# Patient Record
Sex: Female | Born: 1935 | Race: White | Hispanic: No | Smoking: Never smoker
Health system: Southern US, Community
[De-identification: ages and names within clinical notes are randomized; demographics above are authoritative.]

## PROBLEM LIST (undated history)

## (undated) DIAGNOSIS — F0390 Unspecified dementia without behavioral disturbance: Secondary | ICD-10-CM

## (undated) DIAGNOSIS — Z950 Presence of cardiac pacemaker: Secondary | ICD-10-CM

## (undated) DIAGNOSIS — I2510 Atherosclerotic heart disease of native coronary artery without angina pectoris: Secondary | ICD-10-CM

---

## 2014-11-13 ENCOUNTER — Encounter (HOSPITAL_COMMUNITY): Payer: Self-pay | Admitting: *Deleted

## 2014-11-13 ENCOUNTER — Emergency Department (HOSPITAL_COMMUNITY)
Admission: EM | Admit: 2014-11-13 | Discharge: 2014-11-13 | Disposition: A | Discharge status: Home / Self Care | Payer: Medicare Other | Attending: Emergency Medicine | Admitting: Emergency Medicine

## 2014-11-13 ENCOUNTER — Emergency Department (HOSPITAL_COMMUNITY): Payer: Medicare Other

## 2014-11-13 DIAGNOSIS — I2510 Atherosclerotic heart disease of native coronary artery without angina pectoris: Secondary | ICD-10-CM | POA: Diagnosis not present

## 2014-11-13 DIAGNOSIS — Z880 Allergy status to penicillin: Secondary | ICD-10-CM | POA: Diagnosis not present

## 2014-11-13 DIAGNOSIS — Z950 Presence of cardiac pacemaker: Secondary | ICD-10-CM | POA: Insufficient documentation

## 2014-11-13 DIAGNOSIS — F0390 Unspecified dementia without behavioral disturbance: Secondary | ICD-10-CM | POA: Insufficient documentation

## 2014-11-13 DIAGNOSIS — R4182 Altered mental status, unspecified: Secondary | ICD-10-CM | POA: Diagnosis present

## 2014-11-13 DIAGNOSIS — F0391 Unspecified dementia with behavioral disturbance: Secondary | ICD-10-CM

## 2014-11-13 HISTORY — DX: Presence of cardiac pacemaker: Z95.0

## 2014-11-13 HISTORY — DX: Atherosclerotic heart disease of native coronary artery without angina pectoris: I25.10

## 2014-11-13 LAB — COMPREHENSIVE METABOLIC PANEL
ALT: 11 U/L — ABNORMAL LOW (ref 14–54)
ANION GAP: 10 (ref 5–15)
AST: 20 U/L (ref 15–41)
Albumin: 3.7 g/dL (ref 3.5–5.0)
Alkaline Phosphatase: 52 U/L (ref 38–126)
BUN: 17 mg/dL (ref 6–20)
CHLORIDE: 106 mmol/L (ref 101–111)
CO2: 24 mmol/L (ref 22–32)
CREATININE: 0.83 mg/dL (ref 0.44–1.00)
Calcium: 9.4 mg/dL (ref 8.9–10.3)
Glucose, Bld: 111 mg/dL — ABNORMAL HIGH (ref 65–99)
POTASSIUM: 3.8 mmol/L (ref 3.5–5.1)
SODIUM: 140 mmol/L (ref 135–145)
Total Bilirubin: 0.5 mg/dL (ref 0.3–1.2)
Total Protein: 6.5 g/dL (ref 6.5–8.1)

## 2014-11-13 LAB — CBC WITH DIFFERENTIAL/PLATELET
Basophils Absolute: 0 10*3/uL (ref 0.0–0.1)
Basophils Relative: 1 %
EOS ABS: 0.1 10*3/uL (ref 0.0–0.7)
EOS PCT: 2 %
HCT: 37.1 % (ref 36.0–46.0)
Hemoglobin: 12.2 g/dL (ref 12.0–15.0)
LYMPHS ABS: 1.3 10*3/uL (ref 0.7–4.0)
LYMPHS PCT: 21 %
MCH: 31.7 pg (ref 26.0–34.0)
MCHC: 32.9 g/dL (ref 30.0–36.0)
MCV: 96.4 fL (ref 78.0–100.0)
MONO ABS: 0.3 10*3/uL (ref 0.1–1.0)
Monocytes Relative: 5 %
Neutro Abs: 4.5 10*3/uL (ref 1.7–7.7)
Neutrophils Relative %: 71 %
PLATELETS: 242 10*3/uL (ref 150–400)
RBC: 3.85 MIL/uL — AB (ref 3.87–5.11)
RDW: 13.4 % (ref 11.5–15.5)
WBC: 6.2 10*3/uL (ref 4.0–10.5)

## 2014-11-13 LAB — URINALYSIS, ROUTINE W REFLEX MICROSCOPIC
BILIRUBIN URINE: NEGATIVE
Glucose, UA: NEGATIVE mg/dL
Ketones, ur: NEGATIVE mg/dL
Leukocytes, UA: NEGATIVE
NITRITE: NEGATIVE
PROTEIN: NEGATIVE mg/dL
SPECIFIC GRAVITY, URINE: 1.007 (ref 1.005–1.030)
UROBILINOGEN UA: 0.2 mg/dL (ref 0.0–1.0)
pH: 6 (ref 5.0–8.0)

## 2014-11-13 LAB — URINE MICROSCOPIC-ADD ON

## 2014-11-13 MED ORDER — QUETIAPINE FUMARATE 50 MG PO TABS: 50.0000 mg | ORAL_TABLET | Freq: Every day | ORAL | Status: AC

## 2014-11-13 MED ORDER — ALPRAZOLAM 0.25 MG PO TABS
1.0000 mg | ORAL_TABLET | Freq: Once | ORAL | Status: AC
  Administered 2014-11-13: 1 mg via ORAL
  Filled 2014-11-13: qty 4

## 2014-11-13 MED ORDER — ALPRAZOLAM 0.5 MG PO TABS: 0.5000 mg | ORAL_TABLET | Freq: Three times a day (TID) | ORAL | Status: AC | PRN

## 2014-11-13 NOTE — Discharge Instructions (Signed)
°Emergency Department Resource Guide °1) Find a Doctor and Pay Out of Pocket °Although you won't have to find out who is covered by your insurance plan, it is a good idea to ask around and get recommendations. You will then need to call the office and see if the doctor you have chosen will accept you as a new patient and what types of options they offer for patients who are self-pay. Some doctors offer discounts or will set up payment plans for their patients who do not have insurance, but you will need to ask so you aren't surprised when you get to your appointment. ° °2) Contact Your Local Health Department °Not all health departments have doctors that can see patients for sick visits, but many do, so it is worth a call to see if yours does. If you don't know where your local health department is, you can check in your phone book. The CDC also has a tool to help you locate your state's health department, and many state websites also have listings of all of their local health departments. ° °3) Find a Walk-in Clinic °If your illness is not likely to be very severe or complicated, you may want to try a walk in clinic. These are popping up all over the country in pharmacies, drugstores, and shopping centers. They're usually staffed by nurse practitioners or physician assistants that have been trained to treat common illnesses and complaints. They're usually fairly quick and inexpensive. However, if you have serious medical issues or chronic medical problems, these are probably not your best option. ° °No Primary Care Doctor: °- Call [facility] at  832-8000 - they can help you locate a primary care doctor that  accepts your insurance, provides certain services, etc. °- Physician Referral Service- 1-800-533-3463 ° °Chronic Pain Problems: °Organization         Address  Phone   Notes  °[facility]  (336) 297-2271 Patients need to be referred by their primary care doctor.  ° °Medication  Assistance: °Organization         Address  Phone   Notes  °[place] [facility] [address] °[place], [place] [address] (336) 641-8030 --Must be a resident of [place] °-- Must have NO insurance coverage whatsoever (no Medicaid/ Medicare, etc.) °-- The pt. MUST have a primary care doctor that directs their care regularly and follows them in the community °  °[facility]  (866) 331-1348   °[employer]  (888) 892-1162   ° °Agencies that provide inexpensive medical care: °Organization         Address  Phone   Notes  °[facility]  (336) 832-8035   °[facility]    (336) 832-7272   °[facility] [address] °[place], [place] [address] (336) 832-4777   °[facility] [address], °[place] (336) 271-4999   °[facility]    (336) 373-0678   °[facility]    (336) 272-1050   °[facility] ° [address], [place] Phone:  (336) 832-4444, Fax:  (336) 832-4440 Hours of Operation:  9 am - 6 pm, M-F.  Also accepts Medicaid/Medicare and self-pay.  ° [facility] ° [address], [place] Phone: (336) 832-3150, Fax: (336) 832-3151. Hours of Operation:  8:30 am - 5:30 pm, M-F.  Also accepts Medicaid and self-pay.  °[facility] [place] [address], [place] Phone: (336) 878-6027   °[facility] [address], [place], [place] (336)723-1848, Ext. 123 Mondays & Thursdays: 7-9 AM.  First 15 patients are seen on a first come, first serve basis. °  ° °Medicaid-accepting [place] Providers: ° °Organization         Address  Phone   Notes  °[facility] [address], [place] (336) 641-2100 Also accepts self-pay patients.  °[facility] [address], [place] ° (336) 856-9996   °[facility] [address], [place]  (336) 288-8857   °[facility] [address] [place] [address], [place] (336) 299-7000   °Veita Bland [address], [place]  ° (336) 373-1557 Only accepts [place] Access Medicaid patients after they have their name applied to their card.  ° °Self-Pay (no insurance) in [place]: ° °Organization         Address  Phone   Notes  °Sickle Cell Patients, [facility] [address], [place] (336) 832-1970   °[facility] [address], [place] (336) 832-4400   °[facility] ° [address],  (336) 992-4800   °[facility]/Dr. Osei-Bonsu ° [address] [place] [address], [place] or [address], [place] (336) 841-8500 Phone number for both [place] and [place] locations is the same.  °[facility] [address], [place] (336) 299-0000   °[facility] [address] [place] [address], [place] or [address] (336) 852-7530 °(336) 878-2260   °[facility] [address], [place] (336) 350-1642, phone; (336) 294-5005, fax Sees patients 1st and 3rd Saturday of every month.  Must not qualify for public or private insurance (i.e. Medicaid, Medicare, [place] Health Choice, Veterans' Benefits) • Household income should be no more than 200% of the poverty level •The clinic cannot treat you if you are pregnant or think you are pregnant • Sexually transmitted diseases are not treated at the clinic.  ° ° °Dental Care: °Organization         Address  Phone  Notes  °[place] [facility] [address], [place] (336) 641-6152 Accepts children up to age 21 who are enrolled in Medicaid or [place] Health Choice; pregnant women with a Medicaid card; and children who have applied for Medicaid or [place] Health Choice, but were declined, whose parents can pay a reduced fee at time of service.  °[place]  [facility] [place]  [address], [place] (336) 641-7733 Accepts children up to age 21 who are enrolled in Medicaid or [place] Health Choice; pregnant women with a Medicaid card; and children who have applied for Medicaid or [place] Health Choice, but were declined, whose parents can pay a reduced fee at time of service.  °[facility] ° [address], [place] (336) 641-4533 Patients are seen by appointment only. Walk-ins are not accepted. [facility] will see patients 18 years of age and older. °Monday - Tuesday (8am-5pm) °Most Wednesdays (8:30-5pm) °$30 per visit, cash only  °[facility] ° [address], [place] (336) 641-4533 Patients are seen by appointment only. Walk-ins are not accepted. [facility] will see patients 18 years of age and older. °One   Wednesday Evening (Monthly: Volunteer Based).  $30 per visit, cash only  °[facility]  (919) 537-3737 for adults; Children under age 4, call Graduate Pediatric Dentistry at (919) [phone]. Children aged 4-14, please call (919) 537-3737 to request a pediatric application. ° Dental services are provided in all areas of dental care including fillings, crowns and bridges, complete and partial dentures, implants, gum treatment, root canals, and extractions. Preventive care is also provided. Treatment is provided to both adults and children. °Patients are selected via a lottery and there is often a waiting list. °  °[facility] [address], °[place] ° (336) 763-8833 [email or web address] °  °[facility] [address], [place], [place] (336)723-1848, Ext. 123 Second and Fourth Thursday of each month, opens at 6:30 AM; Clinic ends at 9 AM.  Patients are seen on a first-come first-served basis, and a limited number are seen during each clinic.  ° °[facility] ° [address], [place], [place] (336) 723-7904    Eligibility Requirements °You must have lived in [place], [place], or [place] counties for at least the last three months. °  You cannot be eligible for state or federal sponsored healthcare insurance, including Veterans Administration, Medicaid, or Medicare. °  You generally cannot be eligible for healthcare insurance through your employer.  °  How to apply: °Eligibility screenings are held every Tuesday and Wednesday afternoon from 1:00 pm until 4:00 pm. You do not need an appointment for the interview!  °[facility] [address], [place], [place] 336-631-2330   °[facility]  336-342-8273   °[facility]  336-703-3100   °[facility]  336-570-6415   ° °Behavioral Health Resources in the Community: °Intensive Outpatient Programs °Organization         Address  Phone  Notes  °[facility] [address], [place], [place] 336-878-6098   °[facility] [address], [place], [place] 336-832-9800   °[facility] [address], [place], [place] ° 336-882-2125   °[facility] [address],  °[place], [place] 1-800-853-5163 or 336-641-4981   °Substance Abuse Resources °Organization         Address  Phone  Notes  °[facility]  336-882-2125   °[facility]  336-784-9470   °[facility]  336-285-9073   °[facility]  336-845-3988   °Residential & Outpatient Substance Abuse Program  1-800-659-3381   °Psychological Services °Organization         Address  Phone  Notes  °[facility]  336- 832-9600   °[facility]  336- 378-7881   °[facility] [address], [place] 1-800-853-5163 or 336-641-4981   ° °Mobile Crisis Teams °Organization         Address  Phone  Notes  °[facility], Mobile Crisis Care Unit  1-877-626-1772   °Assertive °[facility] ° [address], [place] 336-834-9664   °Sharon DeEsch [address] °[place] 336-554-5454   ° °Self-Help/Support Groups °Organization         Address  Phone             Notes  °[facility] - variety of support groups  336- 373-1402 Call for more information  °Narcotics Anonymous (NA), [facility] [address], °[place]  2 meetings at this location  ° °  Residential Treatment Programs °Organization         Address  Phone  Notes  °[facility] [address],    °[place]  1-866-801-8205   °[facility] ° [address], [place], [place] 704-293-8524   °[facility] [address], [place] 336-845-3988 Admissions: 8am-3pm M-F  °[facility] [address]    °[place], [place] 336-841-1104   °[facility] [address], [place], [place] 336-379-7146   °[facility] [address]  °[place], [place] 336-285-9073   °[facility] - Intensive Outpatient [address], [place], [place] 336-852-3033   °[facility] [address]  °[place], [place] 1-877-615-2722 or 336-784-9470   °[facility] [address] [place], [place] 336-227-7417 Accepts Medicaid  °[facility] [address]  °[place] 1-800-659-3381 Substance Abuse/Addiction Treatment  ° °[place] Behavioral Health Resources °Organization         Address  Phone  Notes  °[facility]  (888) 581-9988   °Julie Brannon, PhD [address] [place], [place]   (336) 349-5553 or (336) 951-0000   °[facility]   [address] °[place], [place] (336) 349-4454   °[facility] [address], [place], [place] (336) 342-8316 Insurance/Medicaid/sponsorship through [facility]  °[facility] [address]                                    [place], [place] (336) 342-8316 Therapy/tele-psych/case    °[facility] [address]  ° [place], [place] (336) 349-2233    °Dr. Arfeen  (336) 349-4544   °[facility] [place]  [employer] [place] [facility] 1) [address], [place] °2) [address], [place] °3)  [address], [place] (336) 349-3220 °(336) 342-7768 ° °(336) 342-8140   °[place] [facility] (336) 342-1394 or (336) 342-3537 (After Hours)    ° ° °

## 2014-11-13 NOTE — ED Notes (Signed)
Pt son and granddaughter spoke with this RN regarding pt status. Seeking placement for pt at [facility] and pt is supposed to be evaluated today at 1030 by their facility.

## 2014-11-13 NOTE — ED Notes (Signed)
The npt has had more confusioin in the past 24 hours than usual more confused since 0400am today  She has been incontinent in the past 24 hours  Hx of the same.  She takes tylenol for knee pain and she may have had a temp  C/o rt flank pain

## 2014-11-13 NOTE — ED Notes (Signed)
The pt has also been wandering off in the neibhborhood.  She is more agitated at home .  She acts calm in public

## 2014-11-13 NOTE — ED Provider Notes (Signed)
CSN: [id number]     Arrival date & time 11/13/14  4098 History   First MD Initiated Contact with Patient 11/13/14 [phone]     Chief Complaint  Patient presents with  . Altered Mental Status     (Consider location/radiation/quality/duration/timing/severity/associated sxs/prior Treatment) HPI Comments: An 81 year old female here with her family member secondary to altered mental status. Patient has a history of dementia, however over the last few months and is progressively got worse to the point where she is getting ready to go to work there for which she's going to have an assessment today. This morning around 0300 patient left the house and was wondering the streets. This is new for her. Family is concerned she may have UTI she sometimes gets altered with UTIs. No other associated symptoms.  Patient is a 81 y.o. female presenting with altered mental status. The history is provided by the patient, a relative and a caregiver.  Altered Mental Status Presenting symptoms: behavior changes and confusion   Severity:  Moderate Most recent episode:  More than 2 days ago Episode history:  Continuous Timing:  Constant Progression:  Worsening Chronicity:  Chronic Context: dementia   Context: not alcohol use, not drug use, not a nursing home resident, not a recent change in medication and not a recent illness   Associated symptoms: no abdominal pain, no agitation, no fever, no hallucinations and no headaches     Past Medical History  Diagnosis Date  . Coronary artery disease   . Pacemaker    History reviewed. No pertinent past surgical history. No family history on file. Social History  Substance Use Topics  . Smoking status: Never Smoker   . Smokeless tobacco: None  . Alcohol Use: No   OB History    No data available     Review of Systems  Constitutional: Negative for fever.  Eyes: Negative for pain.  Respiratory: Negative for cough and shortness of breath.   Gastrointestinal:  Negative for abdominal pain.  Neurological: Negative for headaches.  Psychiatric/Behavioral: Positive for confusion. Negative for hallucinations and agitation.  All other systems reviewed and are negative.     Allergies  Penicillins  Home Medications   Prior to Admission medications   Medication Sig Start Date End Date Taking? Authorizing Provider  ALPRAZolam Prudy Feeler) 0.5 MG tablet Take 1 tablet (0.5 mg total) by mouth 3 (three) times daily as needed for anxiety. 11/13/14   Marily Memos, MD  QUEtiapine (SEROQUEL) 50 MG tablet Take 1 tablet (50 mg total) by mouth at bedtime. 11/13/14   Barbara Cower Mesner, MD   BP 104/50 mmHg  Pulse 86  Temp(Src) 98.4 F (36.9 C) (Oral)  Resp 17  SpO2 92% Physical Exam  Constitutional: She appears well-developed and well-nourished.  HENT:  Head: Normocephalic and atraumatic.  Eyes: Conjunctivae and EOM are normal. Right eye exhibits no discharge. Left eye exhibits no discharge.  Cardiovascular: Normal rate and regular rhythm.   Pulmonary/Chest: Effort normal and breath sounds normal. No respiratory distress.  Abdominal: Soft. She exhibits no distension. There is no tenderness. There is no rebound.  Musculoskeletal: Normal range of motion. She exhibits no edema or tenderness.  Neurological: She is alert. She has normal strength. No cranial nerve deficit or sensory deficit. GCS eye subscore is 4. GCS verbal subscore is 5. GCS motor subscore is 6.  Skin: Skin is warm and dry.  Nursing note and vitals reviewed.   ED Course  Procedures (including critical care time) Labs Review Labs Reviewed  URINALYSIS, ROUTINE W REFLEX MICROSCOPIC (NOT AT [facility]) - Abnormal; Notable for the following:    APPearance CLOUDY (*)    Hgb urine dipstick MODERATE (*)    All other components within normal limits  URINE MICROSCOPIC-ADD ON - Abnormal; Notable for the following:    Casts HYALINE CASTS (*)    All other components within normal limits  CBC WITH  DIFFERENTIAL/PLATELET - Abnormal; Notable for the following:    RBC 3.85 (*)    All other components within normal limits  COMPREHENSIVE METABOLIC PANEL - Abnormal; Notable for the following:    Glucose, Bld 111 (*)    ALT 11 (*)    All other components within normal limits    Imaging Review Dg Chest 2 View  11/13/2014   CLINICAL DATA:  Altered mental status.  EXAM: CHEST  2 VIEW  COMPARISON:  None.  FINDINGS: The cardiac silhouette, mediastinal and hilar contours are within normal limits. There is tortuosity and calcification of the thoracic aorta. The pacer wires are in good position. The lungs are clear. The bony thorax is intact.  IMPRESSION: No acute cardiopulmonary findings.   Electronically Signed   By: Rudie Meyer M.D.   On: 11/13/2014 09:01   Ct Head Wo Contrast  11/13/2014   CLINICAL DATA:  Confusion, evaluate for hydrocephalus  EXAM: CT HEAD WITHOUT CONTRAST  TECHNIQUE: Contiguous axial images were obtained from the base of the skull through the vertex without intravenous contrast.  COMPARISON:  None.  FINDINGS: Age-appropriate significant diffuse cortical atrophy. Mild diffuse low attenuation in the white matter. No evidence of hydrocephalus. No hemorrhage or extra-axial fluid. No evidence of mass or vascular territory infarct. Calvarium intact. Visualized portions of the sinuses clear.  IMPRESSION: Age-related involutional change with no acute findings   Electronically Signed   By: Esperanza Heir M.D.   On: 11/13/2014 08:27   I have personally reviewed and evaluated these images and lab results as part of my medical decision-making.   EKG Interpretation   Date/Time:  Sunday November 13 2014 08:05:04 EDT Ventricular Rate:  76 PR Interval:  203 QRS Duration: 182 QT Interval:  462 QTC Calculation: 519 R Axis:   -59 Text Interpretation:  Sinus rhythm Nonspecific IVCD with LAD Left  ventricular hypertrophy Confirmed by MESNER MD, Barbara Cower [phone]) on 11/13/2014  8:27:20 AM       MDM   Final diagnoses:  Dementia, with behavioral disturbance   Likely worsening dementia, will eval for medical causes. Likely dc back to son's house to work on placement.  Workup without acute causes for symptoms. Likely worsening sundowning r/t dementia. Will start on seroquel nightly to be titrated by her pcp.  I have personally and contemperaneously reviewed labs and imaging and used in my decision making as above.   A medical screening exam was performed and I feel the patient has had an appropriate workup for their chief complaint at this time and likelihood of emergent condition existing is low. They have been counseled on decision, discharge, follow up and which symptoms necessitate immediate return to the emergency department. They or their family verbally stated understanding and agreement with plan and discharged in stable condition.      Marily Memos, MD 11/13/14 1239

## 2015-01-19 ENCOUNTER — Emergency Department: Payer: Medicare Other

## 2015-01-19 ENCOUNTER — Inpatient Hospital Stay
Admission: EM | Admit: 2015-01-19 | Discharge: 2015-01-26 | DRG: 690 | Disposition: E | Payer: Medicare Other | Attending: Internal Medicine | Admitting: Internal Medicine

## 2015-01-19 ENCOUNTER — Encounter: Payer: Self-pay | Admitting: Emergency Medicine

## 2015-01-19 DIAGNOSIS — I2510 Atherosclerotic heart disease of native coronary artery without angina pectoris: Secondary | ICD-10-CM | POA: Diagnosis present

## 2015-01-19 DIAGNOSIS — R401 Stupor: Secondary | ICD-10-CM | POA: Diagnosis present

## 2015-01-19 DIAGNOSIS — E876 Hypokalemia: Secondary | ICD-10-CM | POA: Diagnosis present

## 2015-01-19 DIAGNOSIS — W19XXXA Unspecified fall, initial encounter: Secondary | ICD-10-CM | POA: Diagnosis present

## 2015-01-19 DIAGNOSIS — I119 Hypertensive heart disease without heart failure: Secondary | ICD-10-CM | POA: Diagnosis present

## 2015-01-19 DIAGNOSIS — N390 Urinary tract infection, site not specified: Principal | ICD-10-CM | POA: Diagnosis present

## 2015-01-19 DIAGNOSIS — Z950 Presence of cardiac pacemaker: Secondary | ICD-10-CM | POA: Diagnosis not present

## 2015-01-19 DIAGNOSIS — Z7982 Long term (current) use of aspirin: Secondary | ICD-10-CM | POA: Diagnosis not present

## 2015-01-19 DIAGNOSIS — Z66 Do not resuscitate: Secondary | ICD-10-CM | POA: Diagnosis present

## 2015-01-19 DIAGNOSIS — R292 Abnormal reflex: Secondary | ICD-10-CM | POA: Diagnosis present

## 2015-01-19 DIAGNOSIS — Z79899 Other long term (current) drug therapy: Secondary | ICD-10-CM | POA: Diagnosis not present

## 2015-01-19 DIAGNOSIS — R4182 Altered mental status, unspecified: Secondary | ICD-10-CM | POA: Diagnosis present

## 2015-01-19 DIAGNOSIS — Z880 Allergy status to penicillin: Secondary | ICD-10-CM | POA: Diagnosis not present

## 2015-01-19 DIAGNOSIS — R918 Other nonspecific abnormal finding of lung field: Secondary | ICD-10-CM | POA: Diagnosis present

## 2015-01-19 DIAGNOSIS — F0391 Unspecified dementia with behavioral disturbance: Secondary | ICD-10-CM | POA: Diagnosis present

## 2015-01-19 HISTORY — DX: Unspecified dementia, unspecified severity, without behavioral disturbance, psychotic disturbance, mood disturbance, and anxiety: F03.90

## 2015-01-19 LAB — CBC WITH DIFFERENTIAL/PLATELET
BASOS ABS: 0.1 10*3/uL (ref 0–0.1)
Basophils Relative: 1 %
EOS PCT: 3 %
Eosinophils Absolute: 0.2 10*3/uL (ref 0–0.7)
HCT: 36 % (ref 35.0–47.0)
Hemoglobin: 11.8 g/dL — ABNORMAL LOW (ref 12.0–16.0)
LYMPHS PCT: 24 %
Lymphs Abs: 1.4 10*3/uL (ref 1.0–3.6)
MCH: 32.5 pg (ref 26.0–34.0)
MCHC: 32.8 g/dL (ref 32.0–36.0)
MCV: 99.2 fL (ref 80.0–100.0)
MONO ABS: 0.7 10*3/uL (ref 0.2–0.9)
Monocytes Relative: 11 %
Neutro Abs: 3.5 10*3/uL (ref 1.4–6.5)
Neutrophils Relative %: 61 %
PLATELETS: 288 10*3/uL (ref 150–440)
RBC: 3.63 MIL/uL — ABNORMAL LOW (ref 3.80–5.20)
RDW: 13.4 % (ref 11.5–14.5)
WBC: 5.8 10*3/uL (ref 3.6–11.0)

## 2015-01-19 LAB — TROPONIN I: Troponin I: 0.03 ng/mL (ref <0.031)

## 2015-01-19 LAB — COMPREHENSIVE METABOLIC PANEL
ALT: 18 U/L (ref 14–54)
ANION GAP: 9 (ref 5–15)
AST: 20 U/L (ref 15–41)
Albumin: 3.3 g/dL — ABNORMAL LOW (ref 3.5–5.0)
Alkaline Phosphatase: 64 U/L (ref 38–126)
BILIRUBIN TOTAL: 0.7 mg/dL (ref 0.3–1.2)
BUN: 22 mg/dL — AB (ref 6–20)
CALCIUM: 8.8 mg/dL — AB (ref 8.9–10.3)
CHLORIDE: 107 mmol/L (ref 101–111)
CO2: 24 mmol/L (ref 22–32)
CREATININE: 1.16 mg/dL — AB (ref 0.44–1.00)
GFR, EST AFRICAN AMERICAN: 48 mL/min — AB (ref >60)
GFR, EST NON AFRICAN AMERICAN: 41 mL/min — AB (ref >60)
Glucose, Bld: 108 mg/dL — ABNORMAL HIGH (ref 65–99)
POTASSIUM: 3.6 mmol/L (ref 3.5–5.1)
Sodium: 140 mmol/L (ref 135–145)
TOTAL PROTEIN: 6.4 g/dL — AB (ref 6.5–8.1)

## 2015-01-19 LAB — URINALYSIS COMPLETE WITH MICROSCOPIC (ARMC ONLY)
BILIRUBIN URINE: NEGATIVE
Glucose, UA: NEGATIVE mg/dL
Nitrite: NEGATIVE
Protein, ur: 30 mg/dL — AB
Specific Gravity, Urine: 1.013 (ref 1.005–1.030)
pH: 6 (ref 5.0–8.0)

## 2015-01-19 LAB — TSH: TSH: 2.895 u[IU]/mL (ref 0.350–4.500)

## 2015-01-19 MED ORDER — ONDANSETRON HCL 4 MG/2ML IJ SOLN: 4.0000 mg | Freq: Four times a day (QID) | INTRAMUSCULAR | Status: DC | PRN

## 2015-01-19 MED ORDER — CARVEDILOL 3.125 MG PO TABS
3.1250 mg | ORAL_TABLET | Freq: Two times a day (BID) | ORAL | Status: DC
  Administered 2015-01-20: 07:00:00 3.125 mg via ORAL
  Filled 2015-01-19 (×2): qty 1

## 2015-01-19 MED ORDER — ALUM & MAG HYDROXIDE-SIMETH 200-200-20 MG/5ML PO SUSP: 30.0000 mL | Freq: Four times a day (QID) | ORAL | Status: DC | PRN

## 2015-01-19 MED ORDER — ASPIRIN 81 MG PO CHEW
81.0000 mg | CHEWABLE_TABLET | Freq: Every day | ORAL | Status: DC
  Administered 2015-01-20: 81 mg via ORAL
  Filled 2015-01-19 (×2): qty 1

## 2015-01-19 MED ORDER — SENNOSIDES-DOCUSATE SODIUM 8.6-50 MG PO TABS: 1.0000 | ORAL_TABLET | Freq: Every evening | ORAL | Status: DC | PRN

## 2015-01-19 MED ORDER — ALPRAZOLAM 0.25 MG PO TABS
0.2500 mg | ORAL_TABLET | Freq: Three times a day (TID) | ORAL | Status: DC | PRN
  Administered 2015-01-20 (×2): 0.25 mg via ORAL
  Filled 2015-01-19 (×2): qty 1

## 2015-01-19 MED ORDER — ACETAMINOPHEN 325 MG PO TABS: 650.0000 mg | ORAL_TABLET | Freq: Four times a day (QID) | ORAL | Status: DC | PRN

## 2015-01-19 MED ORDER — ENOXAPARIN SODIUM 40 MG/0.4ML ~~LOC~~ SOLN
40.0000 mg | SUBCUTANEOUS | Status: DC
  Administered 2015-01-19 – 2015-01-20 (×2): 40 mg via SUBCUTANEOUS
  Filled 2015-01-19 (×2): qty 0.4

## 2015-01-19 MED ORDER — QUETIAPINE FUMARATE 25 MG PO TABS
50.0000 mg | ORAL_TABLET | Freq: Two times a day (BID) | ORAL | Status: DC
  Administered 2015-01-20 (×2): 50 mg via ORAL
  Filled 2015-01-19 (×3): qty 2

## 2015-01-19 MED ORDER — ACETAMINOPHEN 650 MG RE SUPP: 650.0000 mg | Freq: Four times a day (QID) | RECTAL | Status: DC | PRN

## 2015-01-19 MED ORDER — LOSARTAN POTASSIUM 25 MG PO TABS
12.5000 mg | ORAL_TABLET | Freq: Every day | ORAL | Status: DC
  Administered 2015-01-20: 12.5 mg via ORAL
  Filled 2015-01-19 (×2): qty 1

## 2015-01-19 MED ORDER — SODIUM CHLORIDE 0.9 % IV SOLN
INTRAVENOUS | Status: DC
Start: 2015-01-19 — End: 2015-01-21
  Administered 2015-01-19 – 2015-01-20 (×2): via INTRAVENOUS

## 2015-01-19 MED ORDER — DEXTROSE 5 % IV SOLN
1.0000 g | INTRAVENOUS | Status: DC
  Administered 2015-01-19 – 2015-01-20 (×2): 1 g via INTRAVENOUS
  Filled 2015-01-19 (×2): qty 10

## 2015-01-19 MED ORDER — ONDANSETRON HCL 4 MG PO TABS: 4.0000 mg | ORAL_TABLET | Freq: Four times a day (QID) | ORAL | Status: DC | PRN

## 2015-01-19 NOTE — ED Notes (Signed)
Pt from [facility] after suffering a fall - unclear whether or not witnessed. Pt was unresponsive after fall. EMS responded; pt had pinpoint non-reactive pupils and was not speaking. They gave her 1 of narcan and she was able to answer some questions. Pt has no narcotics on her med list, but does have benzos on it. Pt woke up to speak to this nurse, but her responses to questions were mostly mumbling.

## 2015-01-19 NOTE — ED Notes (Signed)
Pt returned from ct

## 2015-01-19 NOTE — ED Notes (Signed)
Pt to ct 

## 2015-01-19 NOTE — ED Notes (Signed)
Called to give report, reporting nurse taking report on floor, will call when available.

## 2015-01-19 NOTE — ED Provider Notes (Signed)
Time Seen: Approximately 1730  I have reviewed the triage notes  Chief Complaint: Fall and Altered Mental Status   History of Present Illness: Soriah Leeman is a 81 y.o. female *who presents with acute onset of altered mental status. According to the staff at [facility] the patient suffered a fall prior to arrival. The patient was unresponsive after the fall. Patient was found by EMS to be unresponsive with pinpoint pupils. They gave Narcan and checked blood sugar and she may have had some improvement after Narcan. We will try to answer questions but is mumbling and very stuporous. No record of a fever, vomiting or any other concerns. Past Medical History  Diagnosis Date  . Coronary artery disease   . Pacemaker   . Dementia     Patient Active Problem List   Diagnosis Date Noted  . Altered mental state February 15, 2015    History reviewed. No pertinent past surgical history.  History reviewed. No pertinent past surgical history.  No current outpatient prescriptions on file.  Allergies:  Penicillins  Family History: History reviewed. No pertinent family history.  Social History: Social History  Substance Use Topics  . Smoking status: Never Smoker   . Smokeless tobacco: None  . Alcohol Use: No     Review of Systems:   10 point review of systems was performed and was otherwise negative: Review of systems was attempted to be acquired from the nursing and EMS record according to the nursing staff patient did have fainting somewhat lethargic over the last 48 hours. No focal neurologic deficits were noted Constitutional: No fever Eyes: No visual disturbances ENT: No sore throat, ear pain Cardiac: No chest pain Respiratory: No shortness of breath, wheezing, or stridor Abdomen: No abdominal pain, no vomiting, No diarrhea Endocrine: No weight loss, No night sweats Extremities: No peripheral edema, cyanosis Skin: No rashes, easy bruising Neurologic: No focal weakness, trouble  with speech or swollowing Urologic: No dysuria, Hematuria, or urinary frequency   Physical Exam:  ED Triage Vitals  Enc Vitals Group     BP 15-Feb-2015 1730 164/59 mmHg     Pulse Rate 2015-02-15 1730 74     Resp February 15, 2015 1730 18     Temp 2015-02-15 1732 96.9 F (36.1 C)     Temp Source 15-Feb-2015 1732 Rectal     SpO2 2015/02/15 1730 96 %     Weight February 15, 2015 1732 145 lb 8.1 oz (66 kg)     Height 15-Feb-2015 1732  (1.676 m)     Head Cir --      Peak Flow --      Pain Score 15-Feb-2015 1733 Asleep     Pain Loc --      Pain Edu? --      Excl. in GC? --     General: Patient difficult to arouse and is somewhat stuporous. She will occasionally follow commands and mumbles for speech. Head: Normal cephalic , atraumatic Eyes: Pupils equal , round, reactive to light Nose/Throat: No nasal drainage, patent upper airway without erythema or exudate.  Neck: Supple, Full range of motion, No anterior adenopathy or palpable thyroid masses Lungs: Clear to ascultation without wheezes , rhonchi, or rales Heart: Regular rate, regular rhythm without murmurs , gallops , or rubs Abdomen: Soft, non tender without rebound, guarding , or rigidity; bowel sounds positive and symmetric in all 4 quadrants. No organomegaly .        Extremities: 2 plus symmetric pulses. No edema, clubbing or cyanosis Neurologic: Patient  will attempt to lift her legs up off the stretcher without success against pressure. Strength seems to be symmetric she will grasp with both hands. Skin: warm, dry, no rashes   Labs:   All laboratory work was reviewed including any pertinent negatives or positives listed below:  Labs Reviewed  CBC WITH DIFFERENTIAL/PLATELET - Abnormal; Notable for the following:    RBC 3.63 (*)    Hemoglobin 11.8 (*)    All other components within normal limits  COMPREHENSIVE METABOLIC PANEL - Abnormal; Notable for the following:    Glucose, Bld 108 (*)    BUN 22 (*)    Creatinine, Ser 1.16 (*)    Calcium 8.8 (*)     Total Protein 6.4 (*)    Albumin 3.3 (*)    GFR calc non Af Amer 41 (*)    GFR calc Af Amer 48 (*)    All other components within normal limits  URINALYSIS COMPLETEWITH MICROSCOPIC ([facility] ONLY) - Abnormal; Notable for the following:    Color, Urine YELLOW (*)    APPearance CLOUDY (*)    Ketones, ur TRACE (*)    Hgb urine dipstick 1+ (*)    Protein, ur 30 (*)    Leukocytes, UA TRACE (*)    Bacteria, UA MANY (*)    Squamous Epithelial / LPF 0-5 (*)    All other components within normal limits  URINE CULTURE  URINE CULTURE  TROPONIN I  TSH  BASIC METABOLIC PANEL   review of the laboratory work at time of disposition and shows urinary tract infection  EKG: ED ECG REPORT I, Jennye MoccasinBrian S Kysean Sweet, the attending physician, personally viewed and interpreted this ECG.  Date: 01/09/2015 EKG Time: 1717 Rate: 74 Rhythm: Normal sinus rhythm, poor quality EKG QRS Axis: normal Intervals incomplete right bundle branch block ST/T Wave abnormalities: normal Conduction Disutrbances: none Narrative Interpretation: unremarkable No acute ischemic changes    Radiology: reactive pupils. Altered mental status.  EXAM: CT HEAD WITHOUT CONTRAST  TECHNIQUE: Contiguous axial images were obtained from the base of the skull through the vertex without intravenous contrast.  COMPARISON: 11/13/2014  FINDINGS: Examination is technically limited due to motion artifact. Diffuse cerebral atrophy. Ventricular dilatation consistent with central atrophy. Patchy low-attenuation changes in the deep white matter consistent small vessel ischemia. No mass effect or midline shift. No abnormal extra-axial fluid collections. Gray-white matter junctions are distinct. Basal cisterns are not effaced. No evidence of acute intracranial hemorrhage. No depressed skull fractures. Opacification of the left sphenoid sinus. No acute air-fluid levels in the paranasal sinuses. Mastoid air cells are not  opacified. Vascular calcifications.  IMPRESSION: No acute intracranial abnormalities. Chronic opacification of the sphenoid sinus. Old atrophy and small vessel ischemic changes.   Electronically Signed By: Burman NievesWilliam Stevens M.D. On: 01/07/2015 18:07          DG Chest Port 1 View (Final result) Result time: 12/30/2014 17:38:53   Final result by Rad Results In Interface (01/23/2015 17:38:53)   Narrative:   CLINICAL DATA: Patient with altered mental status. Unable to communicate. History pacer placement.  EXAM: PORTABLE CHEST 1 VIEW  COMPARISON: Chest radiograph 11/13/2014.  FINDINGS: Multi lead pacer apparatus overlies the left hemi thorax, leads are stable in position. Low lung volumes. Stable enlarged cardiac and mediastinal contours. Heterogeneous opacities within the lung bases bilaterally. No definite pleural effusion or pneumothorax.  IMPRESSION: Low lung volumes with bibasilar heterogeneous opacities favored to represent bronchovascular crowding and atelectasis.  I personally reviewed the radiologic studies    ED Course: Patient's stay here was uneventful and she remained hemodynamically stable. She presents with altered mental status Other than the finding of urinary tract infection we cannot ascertain any known causes at this time. Initial head CT is negative for acute cerebrovascular accident she does not appear to have any focal deficits. She is afebrile and does not appear to be septic at this time. As far as her fall and trauma I cannot isolate any significant focal injuries at this time.   Assessment: * Acute altered mental status  Final Clinical Impression:  Final diagnoses:  Stupor     Plan: Inpatient management I spoke to the hospitalist team, further disposition and management depends upon their evaluation.            Jennye Moccasin, MD 01/14/2015 2037

## 2015-01-19 NOTE — H&P (Signed)
[facility] - [place] at [facility]   PATIENT NAME: [name]    MR#:  [id number]  DATE OF BIRTH:  [birth year]  DATE OF ADMISSION:  01/14/2015  PRIMARY CARE PHYSICIAN: No PCP Per Patient   REQUESTING/REFERRING PHYSICIAN: Dr Huel Cote  CHIEF COMPLAINT:  Unresponsive after fall HISTORY OF PRESENT ILLNESS:  [name]  is a 81 y.o. female with a known history of dementia who presents from [place] after an unwitnessed fall. Apparently patient was unresponsive after her fall. EMS was called and apparently patient had pinpoint nonreactive pupils and was not speaking. They gave her 1 dose of Narcan and she is able to answer some questions. Patient does not have any narcotics on her current list of medications however is prescribed Xanax 3 times a day. At baseline patient has moderate to severe dementia. She has had agitation in the past. In speaking with her son he says that she is able to feed herself and walks with a walker. In speaking with the patient's son he has been concerned as the patient has had increased confusion over the past few weeks. She has been recently treated for UTI and he says they repeated a urine analysis recently which was negative. He reports that his mother asked very confused from baseline and agitated when she has a urinary tract infection. In the past he has felt that low-dose Macrobid has helped his mother from chronic urinary tract infections.  PAST MEDICAL HISTORY:   Past Medical History  Diagnosis Date  . Coronary artery disease   . Pacemaker   . Dementia     PAST SURGICAL HISTORY:  None SOCIAL HISTORY:   No history of EtOH or tobacco abuse  FAMILY HISTORY:   Son not aware of any other medical problems in the family DRUG ALLERGIES:   Allergies  Allergen Reactions  . Penicillins    Son unaware of reaction. Penicillin  REVIEW OF SYSTEMS:  Due to dementia review of systems is very limited. Patient does however report  no pain. She is able to follow all commands  MEDICATIONS AT HOME:   Aspirin 81 mg daily  CoQ10 10 mg daily  Losartan 12.5 mg daily  Cor 3.125 mg twice a day  Trazodone 50 mg twice a day  Xanax 0.25 3 times a day hold if confused or sedated  Remeron 7.5 g at bedtime  Quetiapine 50 D milligrams at bedtime                   11/13/14   Marily Memos, MD      VITAL SIGNS:  Blood pressure 164/59, pulse 72, temperature 96.9 F (36.1 C), temperature source Rectal, resp. rate 18, height  (1.676 m), weight 66 kg (145 lb 8.1 oz), SpO2 96 %.  PHYSICAL EXAMINATION:  GENERAL:  81 y.o.-year-old patient lying in the bed with no acute distress.  EYES: Pupils are 3 mm and are sluggish No scleral icterus.  HEENT: Head atraumatic, normocephalic. Oropharynx and nasopharynx clear.  NECK:  Supple, no jugular venous distention. No thyroid enlargement, no tenderness.  LUNGS: Normal breath sounds bilaterally, no wheezing, rales,rhonchi or crepitation. No use of accessory muscles of respiration.  CARDIOVASCULAR: S1, S2 normal. No murmurs, rubs, or gallops.  ABDOMEN: Soft, nontender, nondistended. Bowel sounds present. No organomegaly or mass.  EXTREMITIES: No pedal edema, cyanosis, or clubbing.  NEUROLOGIC: Patient is able to follow commands and moves all of her extremities. There is no focal deficit  PSYCHIATRIC: The  patient is sleepy however does follow commands  SKIN: No obvious rash, lesion, or ulcer.   LABORATORY PANEL:   CBC  Recent Labs Lab 02/18/15 1718  WBC 5.8  HGB 11.8*  HCT 36.0  PLT 288   ------------------------------------------------------------------------------------------------------------------  Chemistries   Recent Labs Lab 02-18-2015 1718  NA 140  K 3.6  CL 107  CO2 24  GLUCOSE 108*  BUN 22*  CREATININE 1.16*  CALCIUM 8.8*  AST 20  ALT 18  ALKPHOS 64  BILITOT 0.7    ------------------------------------------------------------------------------------------------------------------  Cardiac Enzymes  Recent Labs Lab 2015-02-18 1718  TROPONINI 0.03   ------------------------------------------------------------------------------------------------------------------  RADIOLOGY:  Ct Head Wo Contrast  2015-02-18  CLINICAL DATA:  Patient is unresponsive after a fall. Pinpoint non reactive pupils. Altered mental status. EXAM: CT HEAD WITHOUT CONTRAST TECHNIQUE: Contiguous axial images were obtained from the base of the skull through the vertex without intravenous contrast. COMPARISON:  11/13/2014 FINDINGS: Examination is technically limited due to motion artifact. Diffuse cerebral atrophy. Ventricular dilatation consistent with central atrophy. Patchy low-attenuation changes in the deep white matter consistent small vessel ischemia. No mass effect or midline shift. No abnormal extra-axial fluid collections. Gray-white matter junctions are distinct. Basal cisterns are not effaced. No evidence of acute intracranial hemorrhage. No depressed skull fractures. Opacification of the left sphenoid sinus. No acute air-fluid levels in the paranasal sinuses. Mastoid air cells are not opacified. Vascular calcifications. IMPRESSION: No acute intracranial abnormalities. Chronic opacification of the sphenoid sinus. Old atrophy and small vessel ischemic changes. Electronically Signed   By: Burman Nieves M.D.   On: 02/18/2015 18:07   Dg Chest Port 1 View  02-18-2015  CLINICAL DATA:  Patient with altered mental status. Unable to communicate. History pacer placement. EXAM: PORTABLE CHEST 1 VIEW COMPARISON:  Chest radiograph 11/13/2014. FINDINGS: Multi lead pacer apparatus overlies the left hemi thorax, leads are stable in position. Low lung volumes. Stable enlarged cardiac and mediastinal contours. Heterogeneous opacities within the lung bases bilaterally. No definite pleural effusion  or pneumothorax. IMPRESSION: Low lung volumes with bibasilar heterogeneous opacities favored to represent bronchovascular crowding and atelectasis. Electronically Signed   By: Annia Belt M.D.   On: 18-Feb-2015 17:38    EKG:   There is a lot of movement on that EKG. There does appear to be an incomplete right bundle branch block. P waves are seen.   IMPRESSION AND PLAN:    81 year old female with dementia who presents after an unwitnessed fall with altered mental status.  1. Altered mental status: Patient appears to be coming to her baseline. I suspect her increase in agitation and confusion over the past week as his son has reported is due to her urinary tract infection. She does have an allergy to penicillin however he does not know what the allergy is. I will try Rocephin as he does not believe that his mother has had hives or anaphylaxis with that is still in. She will be carefully monitored while on Rocephin. Urine culture was ordered in the emergency department. I do believe that patient would benefit from low-dose Macrobid after completion of her urinary tract infection to prevent recurrent UTI as the son reports that this helped her in the past.  2. Dementia: Patient does appear to be coming to baseline. I will continue Xanax when necessary and quetiapine. I will hold Remeron for now.  3. Hypertension: Continue losartan and Coreg.    All the records are reviewed and case discussed with ED provider. Management plans discussed with  the patient's son and he is in agreement.  CODE STATUS: FULL  TOTAL TIME TAKING CARE OF THIS PATIENT: 50 minutes.    Hasina Kreager M.D on March 14, 2014 at 6:31 PM  Between 7am to 6pm - Pager - [phone] After 6pm go to [email or web address] - password [email or web address] [facility]  [facility]  Office  [phone]  CC: Primary care physician; Dr. Anson CroftsElizabeth Cressey

## 2015-01-20 DIAGNOSIS — R4182 Altered mental status, unspecified: Secondary | ICD-10-CM | POA: Diagnosis present

## 2015-01-20 DIAGNOSIS — N390 Urinary tract infection, site not specified: Secondary | ICD-10-CM | POA: Diagnosis not present

## 2015-01-20 DIAGNOSIS — R401 Stupor: Secondary | ICD-10-CM | POA: Diagnosis not present

## 2015-01-20 LAB — BASIC METABOLIC PANEL
ANION GAP: 7 (ref 5–15)
BUN: 18 mg/dL (ref 6–20)
CALCIUM: 8.4 mg/dL — AB (ref 8.9–10.3)
CO2: 26 mmol/L (ref 22–32)
Chloride: 109 mmol/L (ref 101–111)
Creatinine, Ser: 0.86 mg/dL (ref 0.44–1.00)
GFR calc Af Amer: >60 mL/min (ref >60)
GFR calc non Af Amer: 59 mL/min — ABNORMAL LOW (ref >60)
GLUCOSE: 98 mg/dL (ref 65–99)
Potassium: 3.2 mmol/L — ABNORMAL LOW (ref 3.5–5.1)
SODIUM: 142 mmol/L (ref 135–145)

## 2015-01-20 LAB — MRSA PCR SCREENING: MRSA by PCR: NEGATIVE

## 2015-01-20 MED ORDER — POTASSIUM CHLORIDE 20 MEQ PO PACK
20.0000 meq | PACK | ORAL | Status: AC
  Administered 2015-01-20 (×2): 20 meq via ORAL
  Filled 2015-01-20 (×2): qty 1

## 2015-01-20 MED ORDER — MORPHINE SULFATE (PF) 2 MG/ML IV SOLN
2.0000 mg | Freq: Once | INTRAVENOUS | Status: AC
  Administered 2015-01-20: 21:00:00 2 mg via INTRAVENOUS
  Filled 2015-01-20: qty 1

## 2015-01-20 MED ORDER — HALOPERIDOL LACTATE 5 MG/ML IJ SOLN
0.2500 mg | Freq: Four times a day (QID) | INTRAMUSCULAR | Status: DC | PRN
  Administered 2015-01-20: 14:00:00 2.5 mg via INTRAVENOUS
  Filled 2015-01-20: qty 1

## 2015-01-20 MED ORDER — HALOPERIDOL LACTATE 5 MG/ML IJ SOLN
2.5000 mg | Freq: Four times a day (QID) | INTRAMUSCULAR | Status: DC | PRN
Start: 2015-01-20 — End: 2015-01-21
  Administered 2015-01-20: 2.5 mg via INTRAVENOUS
  Filled 2015-01-20: qty 1

## 2015-01-20 MED ORDER — HYDRALAZINE HCL 20 MG/ML IJ SOLN: 10.0000 mg | Freq: Four times a day (QID) | INTRAMUSCULAR | Status: DC | PRN

## 2015-01-20 MED ORDER — HALOPERIDOL LACTATE 5 MG/ML IJ SOLN
INTRAMUSCULAR | Status: AC
  Filled 2015-01-20: qty 1

## 2015-01-20 NOTE — Care Management Obs Status (Signed)
MEDICARE OBSERVATION STATUS NOTIFICATION   Patient Details  Name: [name] MRN: [id number] Date of Birth: 07/06/1926   Medicare Observation Status Notification Given:  Yes    Gwenette GreetBrenda S Nethra Mehlberg, RN 01/24/2015, 11:57 AM

## 2015-01-20 NOTE — Care Management (Signed)
Informed that [name] does not meet inpatient guidelines per Audie PintoInterqual. Son, Kayla CarwinMike Nelson signed code 44 sheet.  [name] [facility] a [facility]. Kayla GreetBrenda S [name] RN MSN CCM Care Management [phone]

## 2015-01-20 NOTE — Progress Notes (Signed)
At 20:20 pt up and down out of bed holding her stomach and calling out. MD paged for something for pain. MD placed order for 2 mg morphine IV. Morphine given at 20:37. RN back in room at 21:05 to reassess pain. Pt was not breathing. Had another RN come in and verify. MD paged at 21:15 to notify and have him come to floor and pronounce. 21:54 MD to floor to pronounce death. 22:07 pt's next of kin notified. Awaiting their arrival to floor.

## 2015-01-20 NOTE — Discharge Summary (Signed)
[facility] [place] [facility]    Death Note - please see Last Note for all details.  PATIENT NAME: [name]    MR#:  [id number]  DATE OF BIRTH:  05/03/1926  DATE OF ADMISSION:  01/05/2015  PRIMARY CARE PHYSICIAN: No PCP Per Patient   ADMISSION DIAGNOSIS:  Stupor [R40.1]  HISTORY OF PRESENT ILLNESS ON ADMISSION (as per Dr Juliene PinaMody):  Kayla Corkgnes Schappell is a 81 y.o. female with a known history of dementia who [place]. Apparently patient was unresponsive after her fall. EMS was called and apparently patient had pinpoint nonreactive pupils and was not speaking. They gave her 1 dose of Narcan and she is able to answer some questions. Patient does not have any narcotics on her current list of medications however is prescribed Xanax 3 times a day. At baseline patient has moderate to severe dementia. She has had agitation in the past. In speaking with her son he says that she is able to feed herself and walks with a walker. In speaking with the patient's son he has been concerned as the patient has had increased confusion over the past few weeks. She has been recently treated for UTI and he says they repeated a urine analysis recently which was negative. He reports that his mother asked very confused from baseline and agitated when she has a urinary tract infection. In the past he has felt that low-dose Macrobid has helped his mother from chronic urinary tract infections.  HOSPITAL COURSE:  Per chart review patient remained unable to respond much verbally, disoriented and intermittently agitated, but stable through admission until the evening of her death.  Nursing called at 20:20 to notify MD that patient was getting up out of bed a lot and seemed to be having some abdominal discomfort as best as could be determined by the way she was holding her abdomen.  Patient had been diagnosed with UTI, with culture growing gram negative rods, and  was appropriately on Rocephin.  Vitals were stable, with a BP 153/52, HR 51, and afebrile with 97% O2 sats on room air.  Penicillin allergy was only listed allergy on chart.  2 mg IV morphine ordered for discomfort.  MD called by nursing at 20:15 that patient was not breathing when rechecked, and was pronounced by MD at 21:54.      Pronounced dead by Anne HahnWILLIS, Kaydyn Sayas FIELDING on @TODAY @ at 21:54 after 1 full minute of auscultation revealed absent heart and lung sounds, absent corneal and pupillary reflexes, and absent response to painful stimuli.                  Cause of death: UTI   Anne HahnWILLIS, Renard Caperton FIELDING 07-24-14, 10:00 PM  [facility] [place] [facility]    OFFICE [phone]  Total clinical and documentation time for today: <30 minutes

## 2015-01-20 NOTE — Plan of Care (Signed)
Problem: Safety: Goal: Ability to remain free from injury will improve Outcome: Progressing Patient had a couple of episodes of extreme agitation, became slightly combative, walked the unit a couple of times refused to go back to her room, 2.5mg  haldol given iv and she seemed to calm down a little sitting out in hall at this time with Daughter in law, VSS, swallows pills crushed in applesauce, being treated for UTI.

## 2015-01-20 NOTE — Progress Notes (Signed)
MEDICATION RELATED CONSULT NOTE - INITIAL   Pharmacy Consult for Electrolyte monitoring Indication: Hypokalemia    Allergies  Allergen Reactions  . Penicillins     Patient Measurements: Height: 5\' 6"  (167.6 cm) Weight: 134 lb 4.8 oz (60.918 kg) IBW/kg (Calculated) : 59.3   Vital Signs: Temp: 98.5 F (36.9 C) (11/25 0511) Temp Source: Oral (11/25 0511) BP: 166/45 mmHg (11/25 0511) Pulse Rate: 36 (11/25 0511) Intake/Output from previous day: 11/24 0701 - 11/25 0700 In: -  Out: 700 [Urine:700] Intake/Output from this shift: Total I/O In: -  Out: 100 [Urine:100]  Labs:  Recent Labs  01/04/2015 1718 12/30/2014 0658  WBC 5.8  --   HGB 11.8*  --   HCT 36.0  --   PLT 288  --   CREATININE 1.16* 0.86  ALBUMIN 3.3*  --   PROT 6.4*  --   AST 20  --   ALT 18  --   ALKPHOS 64  --   BILITOT 0.7  --    Estimated Creatinine Clearance: 44 mL/min (by C-G formula based on Cr of 0.86).   Microbiology: Recent Results (from the past 720 hour(s))  Urine culture     Status: None (Preliminary result)   Collection Time: 01/15/2015  5:18 PM  Result Value Ref Range Status   Specimen Description URINE, CATHETERIZED  Final   Special Requests NONE  Final   Culture   Final    >=100,000 COLONIES/mL GRAM NEGATIVE RODS IDENTIFICATION AND SUSCEPTIBILITIES TO FOLLOW    Report Status PENDING  Incomplete  MRSA PCR Screening     Status: None   Collection Time: 12/27/2014 12:27 AM  Result Value Ref Range Status   MRSA by PCR NEGATIVE NEGATIVE Final    Comment:        The GeneXpert MRSA Assay (FDA approved for NASAL specimens only), is one component of a comprehensive MRSA colonization surveillance program. It is not intended to diagnose MRSA infection nor to guide or monitor treatment for MRSA infections.     Medical History: Past Medical History  Diagnosis Date  . Coronary artery disease   . Pacemaker   . Dementia     Medications:  Scheduled:  . aspirin  81 mg Oral Daily  .  carvedilol  3.125 mg Oral BID WC  . cefTRIAXone (ROCEPHIN)  IV  1 g Intravenous Q24H  . enoxaparin (LOVENOX) injection  40 mg Subcutaneous Q24H  . haloperidol lactate      . losartan  12.5 mg Oral Daily  . potassium chloride  20 mEq Oral Q2H  . QUEtiapine  50 mg Oral BID   Infusions:  . sodium chloride 50 mL/hr at 01/25/2015 2204    Assessment: Pharmacy consulted for electrolyte replacement for 81 yo female.     Plan:  Will order potassium 20mEq PO q2hr x 2 doses. Will obtain follow-up BMP and magnesium with am labs.    Pharmacy will continue and monitor and adjust per consult.    Simpson,Michael L ,2:49 PM

## 2015-01-20 NOTE — Progress Notes (Signed)
[facility] - [place] at [facility]   PATIENT NAME: [name]    MR#:  [id number]  DATE OF BIRTH:  03/29/1926  SUBJECTIVE:  CHIEF COMPLAINT:   Chief Complaint  Patient presents with  . Fall  . Altered Mental Status    REVIEW OF SYSTEMS:    Review of Systems  Unable to perform ROS: dementia    Nutrition: Tolerating Diet: Tolerating PT:      DRUG ALLERGIES:   Allergies  Allergen Reactions  . Penicillins     VITALS:  Blood pressure 166/45, pulse 36, temperature 98.5 F (36.9 C), temperature source Oral, resp. rate 18, height  (1.676 m), weight 60.918 kg (134 lb 4.8 oz), SpO2 94 %.  PHYSICAL EXAMINATION:   Physical Exam  GENERAL:  81 y.o.-year-old patient lying in the bed with no acute distress.  EYES: Pupils equal, round, reactive to light and accommodation. No scleral icterus. Extraocular muscles intact.  HEENT: Head atraumatic, normocephalic. Oropharynx and nasopharynx clear.  NECK:  Supple, no jugular venous distention. No thyroid enlargement, no tenderness.  LUNGS: Normal breath sounds bilaterally, no wheezing, rales,rhonchi or crepitation. No use of accessory muscles of respiration.  CARDIOVASCULAR: S1, S2 normal. No murmurs, rubs, or gallops.  ABDOMEN: Soft, nontender, nondistended. Bowel sounds present. No organomegaly or mass.  EXTREMITIES: No pedal edema, cyanosis, or clubbing.  NEUROLOGIC: Cranial nerves II through XII are intact. Muscle strength 5/5 in all extremities. Sensation intact. Gait not checked.  PSYCHIATRIC: demented SKIN: No obvious rash, lesion, or ulcer.    LABORATORY PANEL:   CBC  Recent Labs Lab 01/02/2015 1718  WBC 5.8  HGB 11.8*  HCT 36.0  PLT 288   ------------------------------------------------------------------------------------------------------------------  Chemistries   Recent Labs Lab 12/29/2014 1718 01/12/2015 0658  NA 140 142  K 3.6 3.2*  CL 107 109  CO2 24 26  GLUCOSE 108* 98   BUN 22* 18  CREATININE 1.16* 0.86  CALCIUM 8.8* 8.4*  AST 20  --   ALT 18  --   ALKPHOS 64  --   BILITOT 0.7  --    ------------------------------------------------------------------------------------------------------------------  Cardiac Enzymes  Recent Labs Lab 01/06/2015 1718  TROPONINI 0.03   ------------------------------------------------------------------------------------------------------------------  RADIOLOGY:  Ct Head Wo Contrast  01/12/2015  CLINICAL DATA:  Patient is unresponsive after a fall. Pinpoint non reactive pupils. Altered mental status. EXAM: CT HEAD WITHOUT CONTRAST TECHNIQUE: Contiguous axial images were obtained from the base of the skull through the vertex without intravenous contrast. COMPARISON:  11/13/2014 FINDINGS: Examination is technically limited due to motion artifact. Diffuse cerebral atrophy. Ventricular dilatation consistent with central atrophy. Patchy low-attenuation changes in the deep white matter consistent small vessel ischemia. No mass effect or midline shift. No abnormal extra-axial fluid collections. Gray-white matter junctions are distinct. Basal cisterns are not effaced. No evidence of acute intracranial hemorrhage. No depressed skull fractures. Opacification of the left sphenoid sinus. No acute air-fluid levels in the paranasal sinuses. Mastoid air cells are not opacified. Vascular calcifications. IMPRESSION: No acute intracranial abnormalities. Chronic opacification of the sphenoid sinus. Old atrophy and small vessel ischemic changes. Electronically Signed   By: Burman Nieves M.D.   On: 01/06/2015 18:07   Dg Chest Port 1 View  01/18/2015  CLINICAL DATA:  Patient with altered mental status. Unable to communicate. History pacer placement. EXAM: PORTABLE CHEST 1 VIEW COMPARISON:  Chest radiograph 11/13/2014. FINDINGS: Multi lead pacer apparatus overlies the left hemi thorax, leads are stable in position. Low lung volumes. Stable enlarged  cardiac and mediastinal contours. Heterogeneous opacities within the lung bases bilaterally. No definite pleural effusion or pneumothorax. IMPRESSION: Low lung volumes with bibasilar heterogeneous opacities favored to represent bronchovascular crowding and atelectasis. Electronically Signed   By: Annia Beltrew  Davis M.D.   On: 01/05/2015 17:38     ASSESSMENT AND PLAN:   Active Problems:   Altered mental state   Altered mental status secondary to UTI, underlying dementia: "Patient already on Rocephin.   Watch one more day with IV Rocpehin,montor for fever.check post void residual with bladder scan.possibly d/c tomorrow with low dose daily macrobid D/w son 2.HTN;controlled 3.Advanced dementia with agitation ;continue seroquel,use haldol as needed for breakthrough agitation  4.Marland Kitchen.according to son pt had DNR form but could not locate it,so he wants her to be DNR.will place order  5.mild hypokalemia;replace All the records are reviewed and case discussed with Care Management/Social Workerr. Management plans discussed with the patient, family and they are in agreement.  CODE STATUS: full  TOTAL TIME TAKING CARE OF THIS PATIENT: 35 minutes.   POSSIBLE D/C IN 1-2DAYS, DEPENDING ON CLINICAL CONDITION.   Katha HammingKONIDENA,Laiklyn Pilkenton M.D on Oct 16, 2014 at 10:23 AM  Between 7am to 6pm - Pager - [phone]  After 6pm go to [email or web address] - password [email or web address]  [facility]  Office  [phone]  CC: Primary care physician; No PCP Per Patient

## 2015-01-20 NOTE — Care Management Important Message (Signed)
Important Message  Patient Details  Name: [name] MRN: [id number] Date of Birth: 06/10/1926   Medicare Important Message Given:  Yes    Gwenette GreetBrenda S Zackari Ruane, RN 01/19/2015, 10:13 AM

## 2015-01-20 NOTE — Progress Notes (Signed)
Family arrived. RN staff in to comfort family. Chaplin services offered. Family declined. Nursing Supervisor notified. [facility] called.

## 2015-01-20 NOTE — Progress Notes (Signed)
Unable to complete profile because pt cannot answer questions.  Unsure of baseline.   Family went home before pt came to unit.

## 2015-01-20 NOTE — Clinical Social Work Note (Signed)
Clinical Social Work Assessment  Patient Details  Name: [name] MRN: [id number] Date of Birth: [birth year]  Date of referral:  12/31/2014               Reason for consult:  Facility Placement                Permission sought to share information with:  Family Supports Permission granted to share information::  Yes, Verbal Permission Granted  Name::     Son, [name]   Housing/Transportation Living arrangements for the past 2 months:  [place] (Memory Care) Source of Information:  Adult Children Patient Interpreter Needed:  None Criminal Activity/Legal Involvement Pertinent to Current Situation/Hospitalization:  No - Comment as needed Significant Relationships:  Adult Children Lives with:  Facility Resident Do you feel safe going back to the place where you live?  Yes Need for family participation in patient care:  No (Coment)  Care giving concerns:  No care giving needs identified.    Social Worker assessment / plan:  CSW met with pt's son, who is also her POA. CSW introduced herself and explained role of social work. CSW also explained role of social work. Pt's son shared that pt lived with his brother prior to his stroke and then pt came to live with POA. PT was unable to be managed at home, as she got out of the house 2x. Pt was placed at [facility]. Pt's son would like pt to return at discharge. CSW called facility and left a message requesting a return phone call. CSW will continue to follow.   Employment status:  Retired Forensic scientist:   Medicare PT Recommendations:   Not assesseed Information / Referral to community resources:   [place]  Patient/Family's Response to care: Pt's son (POA) was appreciative of CSW support.  Patient/Family's Understanding of and Emotional Response to Diagnosis, Current Treatment, and Prognosis:  Pt's family would like pt to return to memory care as she is unable to managed at home.    Emotional Assessment Appearance:  Appears stated age Attitude/Demeanor/Rapport:  Angry Affect (typically observed):   flat Orientation:  Oriented to Self Alcohol / Substance use:   none Psych involvement (Current and /or in the community):  No (Comment)  Discharge Needs  Concerns to be addressed:  No discharge needs identified Readmission within the last 30 days:  No Current discharge risk:  None Barriers to Discharge:  No Barriers Identified   Darden Dates, LCSW 01/13/2015, 2:21 PM

## 2015-01-20 NOTE — Care Management Note (Signed)
Case Management Note  Patient Details  Name: [name] MRN: [id number] Date of Birth: 12/14/1926  Subjective/Objective:      Pt is a code 5544; Terrilee CroakBrenda Holland ,RN CM is aware and will deliver code 6844 note to son at bedside.          Action/Plan:   Expected Discharge Date:                  Expected Discharge Plan:     In-House Referral:     Discharge planning Services     Post Acute Care Choice:    Choice offered to:     DME Arranged:    DME Agency:     HH Arranged:    HH Agency:     Status of Service:     Medicare Important Message Given:  Yes Date Medicare IM Given:    Medicare IM give by:    Date Additional Medicare IM Given:    Additional Medicare Important Message give by:     If discussed at Long Length of Stay Meetings, dates discussed:    Additional Comments:  Berna BueCheryl Keyundra Fant, RN 03/19/2014, 11:02 AM

## 2015-01-20 NOTE — Plan of Care (Signed)
Problem: Safety: Goal: Ability to remain free from injury will improve Outcome: Progressing Pt remains free from falls.  Bed alarm activated, call bell in reach, bed in lowest position.  Problem: Physical Regulation: Goal: Ability to maintain clinical measurements within normal limits will improve Outcome: Progressing Pt receiving IV antibiotics for UTI.  Continue to monitor. Goal: Will remain free from infection Outcome: Progressing IV antibiotics in place.  Continue to monitor.

## 2015-01-21 LAB — URINE CULTURE

## 2015-01-26 DEATH — deceased

## 2015-03-16 ENCOUNTER — Ambulatory Visit: Payer: Medicare Other | Admitting: Internal Medicine

## 2015-12-30 IMAGING — CT CT HEAD W/O CM
3 series · 17 of 30 positions shown, 18 images · non-contrast
Comparison: 11/13/2014

CLINICAL DATA: Patient is unresponsive after a fall. Pinpoint non
reactive pupils. Altered mental status.

EXAM:
CT HEAD WITHOUT CONTRAST
TECHNIQUE: Contiguous axial images were obtained from the base of the skull
through the vertex without intravenous contrast.

[Series 2: head wo · axial · 0.39mm/px · z∈[-645,-555]mm · 5 of 32 slices shown (1 of 2)]
[im 6/32  brain]
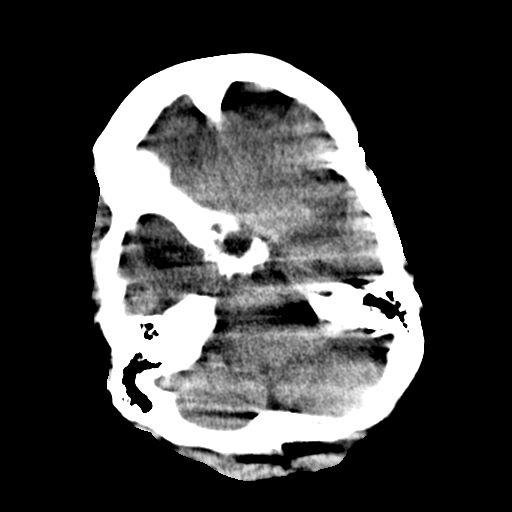
[im 11/32  brain]
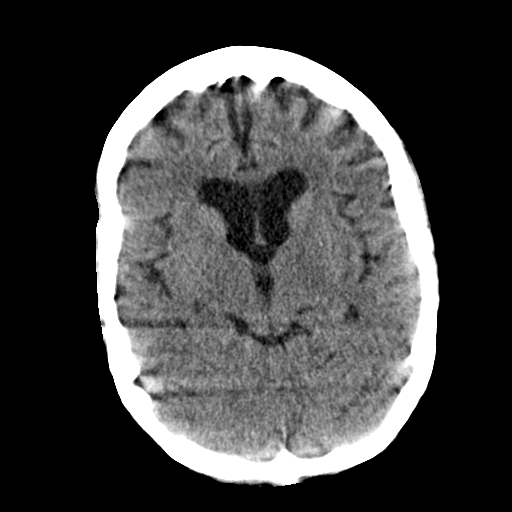
[im 16/32  brain]
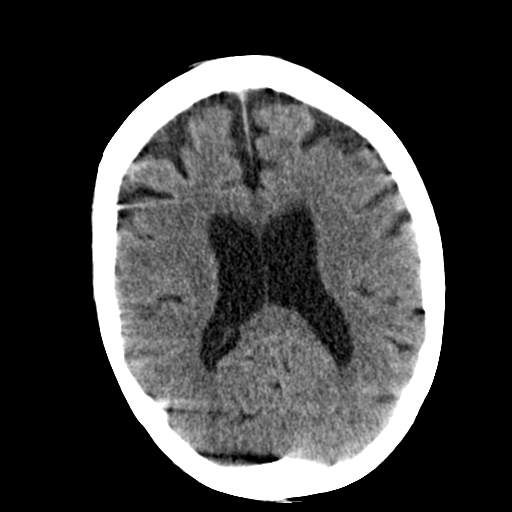
[im 21/32  brain]
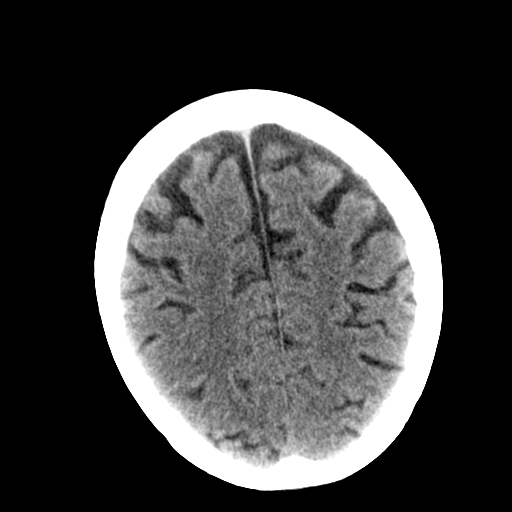
[im 26/32  brain]
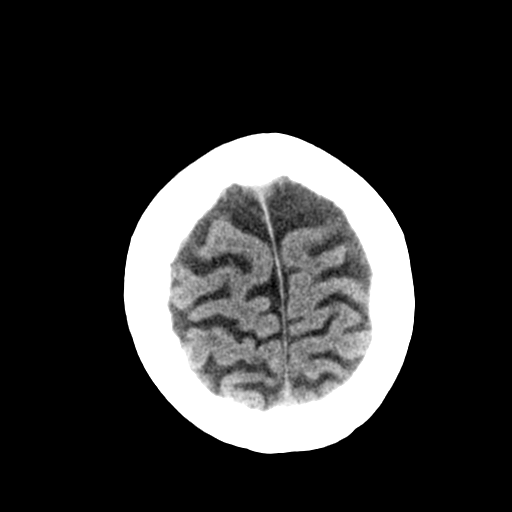

[Series 4: head bone · axial · 0.43mm/px · z∈[-667,-521]mm · 8 of 85 slices shown]
[im 6/85  bone]
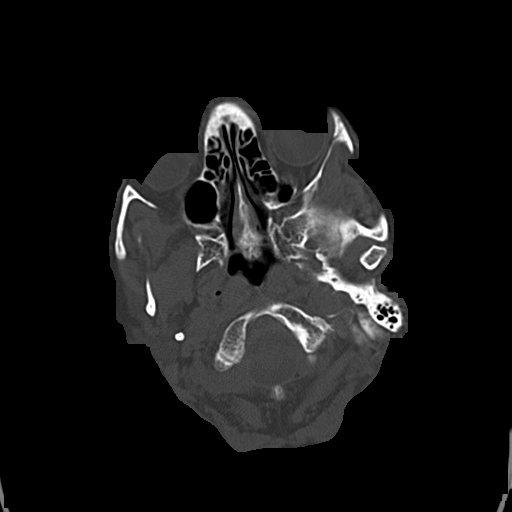
[im 16/85  bone]
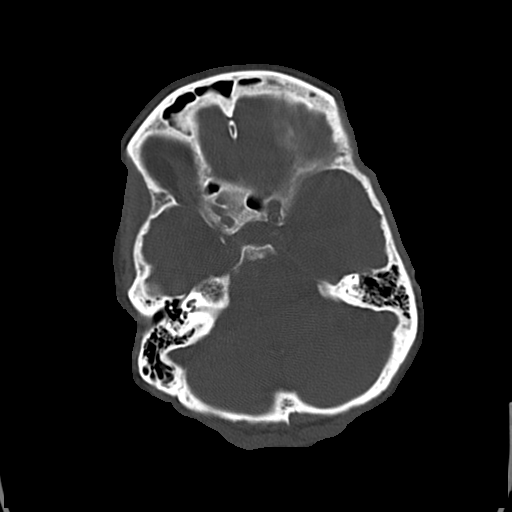
[im 27/85  bone]
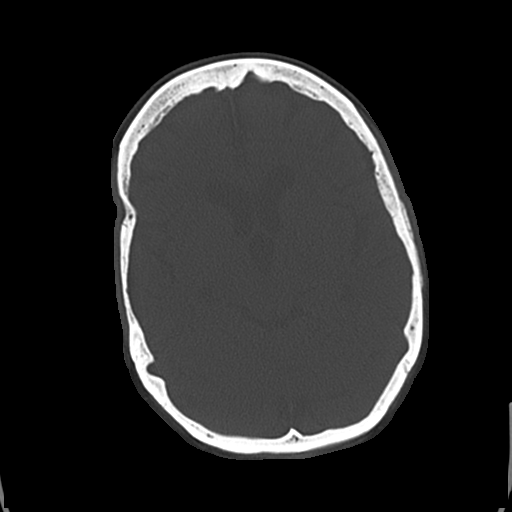
[im 37/85  bone]
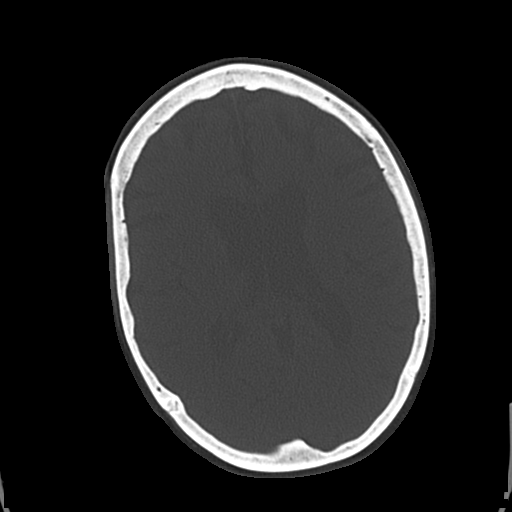
[im 48/85  bone]
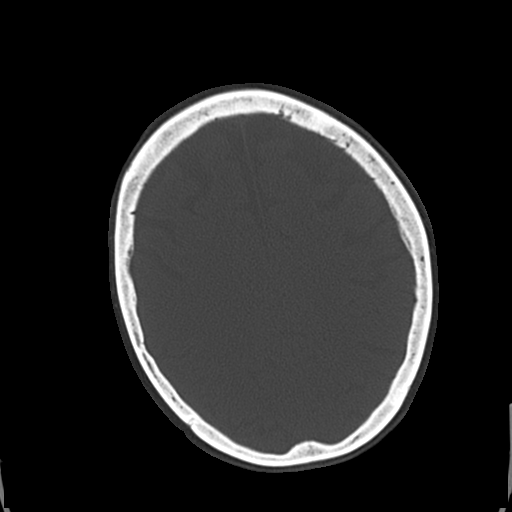
[im 58/85  bone]
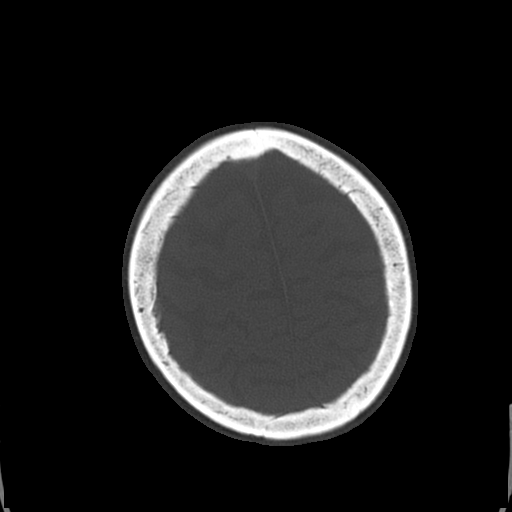
[im 69/85  bone]
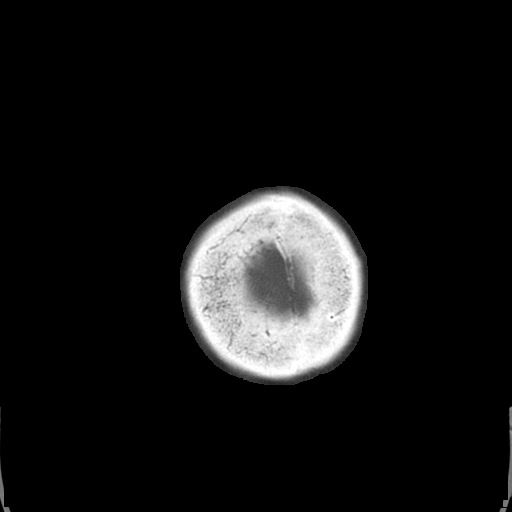
[im 79/85  bone]
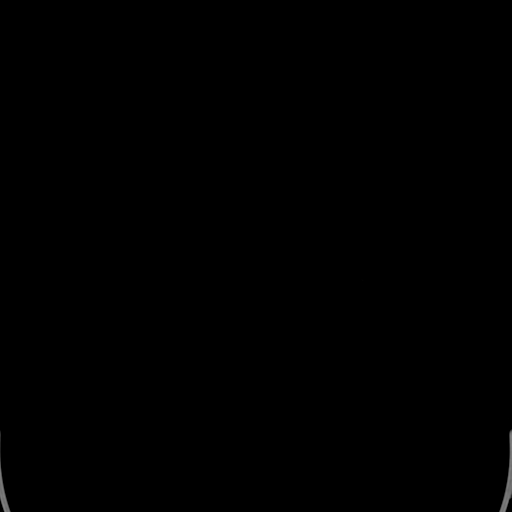

[Series 5: head wo · axial · 0.43mm/px · z∈[-638,-548]mm · 4 of 31 slices shown, 5 images (2 of 2)]
[im 7/31  brain]
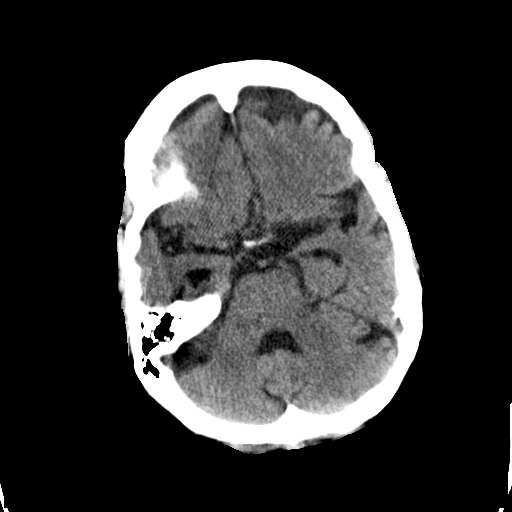
[im 7/31  bone]
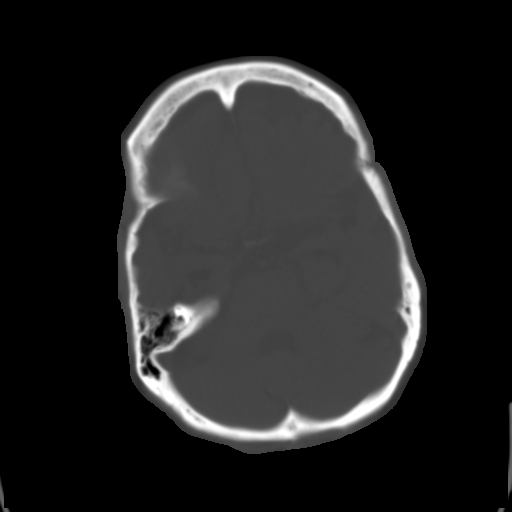
[im 13/31  brain]
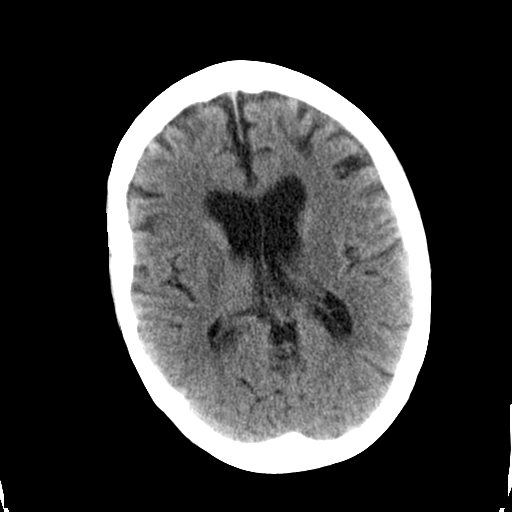
[im 19/31  brain]
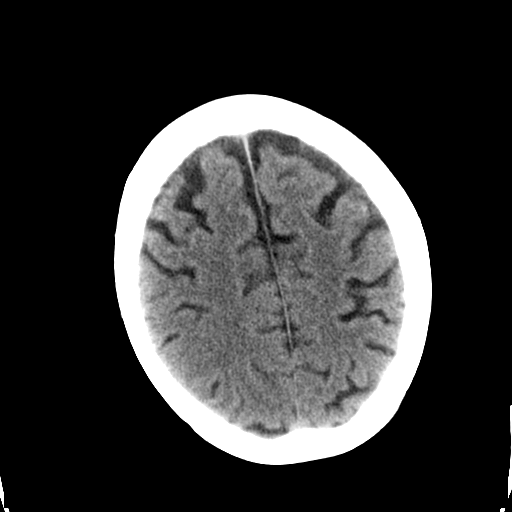
[im 25/31  brain]
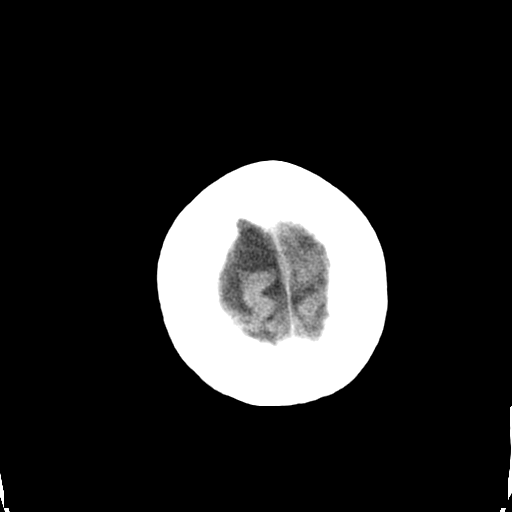

[17 of 30 positions shown; findings below may reference images not displayed]

FINDINGS: Examination is technically limited due to motion artifact. Diffuse
cerebral atrophy. Ventricular dilatation consistent with central
atrophy. Patchy low-attenuation changes in the deep white matter
consistent small vessel ischemia. No mass effect or midline shift.
No abnormal extra-axial fluid collections. Gray-white matter
junctions are distinct. Basal cisterns are not effaced. No evidence
of acute intracranial hemorrhage. No depressed skull fractures.
Opacification of the left sphenoid sinus. No acute air-fluid levels
in the paranasal sinuses. Mastoid air cells are not opacified.
Vascular calcifications.
IMPRESSION: No acute intracranial abnormalities. Chronic opacification of the
sphenoid sinus. Old atrophy and small vessel ischemic changes.
